# Patient Record
Sex: Female | Born: 2000 | Race: White | Hispanic: No | Marital: Single | Smoking: Never smoker
Health system: Southern US, Community
[De-identification: ages and names within clinical notes are randomized; demographics above are authoritative.]

---
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
## 2023-09-27 ENCOUNTER — Emergency Department
Admission: EM | Admit: 2023-09-27 | Discharge: 2023-09-27 | Disposition: A | Discharge status: Home / Self Care | Attending: Emergency Medicine | Admitting: Emergency Medicine

## 2023-09-27 ENCOUNTER — Encounter: Payer: Self-pay | Admitting: Emergency Medicine

## 2023-09-27 ENCOUNTER — Other Ambulatory Visit: Payer: Self-pay

## 2023-09-27 DIAGNOSIS — S60466A Insect bite (nonvenomous) of right little finger, initial encounter: Secondary | ICD-10-CM | POA: Insufficient documentation

## 2023-09-27 DIAGNOSIS — L03011 Cellulitis of right finger: Secondary | ICD-10-CM | POA: Diagnosis not present

## 2023-09-27 DIAGNOSIS — W57XXXA Bitten or stung by nonvenomous insect and other nonvenomous arthropods, initial encounter: Secondary | ICD-10-CM | POA: Insufficient documentation

## 2023-09-27 DIAGNOSIS — R2231 Localized swelling, mass and lump, right upper limb: Secondary | ICD-10-CM | POA: Diagnosis present

## 2023-09-27 MED ORDER — DIPHENHYDRAMINE HCL 25 MG PO CAPS
25.0000 mg | ORAL_CAPSULE | Freq: Four times a day (QID) | ORAL | Status: DC | PRN
  Administered 2023-09-27: 25 mg via ORAL
  Filled 2023-09-27: qty 1

## 2023-09-27 MED ORDER — CEPHALEXIN 750 MG PO CAPS: 750.0000 mg | ORAL_CAPSULE | Freq: Three times a day (TID) | ORAL | 0 refills | Status: AC

## 2023-09-27 MED ORDER — DIPHENHYDRAMINE HCL 50 MG PO TABS: 50.0000 mg | ORAL_TABLET | Freq: Every evening | ORAL | 0 refills | Status: AC | PRN

## 2023-09-27 MED ORDER — CEPHALEXIN 500 MG PO CAPS
500.0000 mg | ORAL_CAPSULE | Freq: Once | ORAL | Status: AC
  Administered 2023-09-27: 500 mg via ORAL
  Filled 2023-09-27: qty 1

## 2023-09-27 MED ORDER — IBUPROFEN 600 MG PO TABS
600.0000 mg | ORAL_TABLET | Freq: Once | ORAL | Status: AC
  Administered 2023-09-27: 600 mg via ORAL
  Filled 2023-09-27: qty 1

## 2023-09-27 MED ORDER — CEPHALEXIN 250 MG/5ML PO SUSR: 500.0000 mg | Freq: Three times a day (TID) | ORAL | 0 refills | Status: DC

## 2023-09-27 NOTE — ED Triage Notes (Signed)
 Pt to ED via POV with c/o insect bite to R 5th digit. Pt states pain and swelling yesterday that resolved, then today pain and swelling returned. Pt with some redness and swelling to R 5th digit. Pt states unsure what the insect was. Pt states pain up her R arm.

## 2023-09-27 NOTE — ED Provider Notes (Signed)
 [facility] Provider Note    Event Date/Time   First MD Initiated Contact with Patient 09/27/23 2136     (approximate)   History   Insect Bite    HPI  Laura Randolph is a 23 y.o. female  with no significant past medical history who presents to the ED complaining of insect bite. According to the patient, yesterday evening she had an insect bite the right fifth finger, had pruritus.  Today the area is tender, red and warmth and the pain is extending to the anterior area of the forearm.  Patient denies fever.      Physical Exam   Triage Vital Signs: ED Triage Vitals  Encounter Vitals Group     BP 09/27/23 2130 (!) 141/88     Systolic BP Percentile --      Diastolic BP Percentile --      Pulse Rate 09/27/23 2130 79     Resp 09/27/23 2130 19     Temp 09/27/23 2130 98.2 F (36.8 C)     Temp Source 09/27/23 2130 Oral     SpO2 09/27/23 2130 100 %     Weight 09/27/23 2128 160 lb (72.6 kg)     Height 09/27/23 2128 5\' 10"  (1.778 m)     Head Circumference --      Peak Flow --      Pain Score 09/27/23 2128 7     Pain Loc --      Pain Education --      Exclude from Growth Chart --     Most recent vital signs: Vitals:   09/27/23 2130  BP: (!) 141/88  Pulse: 79  Resp: 19  Temp: 98.2 F (36.8 C)  SpO2: 100%     Constitutional: Alert, NAD. Able to speak in complete sentences without cough or dyspnea  Eyes: Conjunctivae are normal.  Head: Atraumatic. Nose: No congestion/rhinnorhea. Mouth/Throat: Mucous membranes are moist.   Neck: Painless ROM. Supple. No JVD, nodes, thyromegaly  Cardiovascular:   Good peripheral circulation.RRR no murmurs, gallops, rubs  Respiratory: Normal respiratory effort.  No retractions. Clear to auscultation bilaterally without wheezing or crackles  Gastrointestinal: Soft and nontender.  Musculoskeletal:  no deformity Neurologic:  MAE spontaneously. No gross focal neurologic deficits are appreciated.  Skin: Right  hand: Right 4th and 5th fingers with erythema, warmth, tenderness.  Vesicles or pustules full ROM Psychiatric: Mood and affect are normal. Speech and behavior are normal.    ED Results / Procedures / Treatments   Labs (all labs ordered are listed, but only abnormal results are displayed) Labs Reviewed - No data to display   EKG     RADIOLOGY     PROCEDURES:  Critical Care performed:   Procedures   MEDICATIONS ORDERED IN ED: Medications  cephALEXin (KEFLEX) capsule 500 mg (has no administration in time range)  diphenhydrAMINE  (BENADRYL ) capsule 25 mg (has no administration in time range)  ibuprofen (ADVIL) tablet 600 mg (has no administration in time range)      IMPRESSION / MDM / ASSESSMENT AND PLAN / ED COURSE  I reviewed the triage vital signs and the nursing notes.  Differential diagnosis includes, but is not limited to, insect bite, cellulitis, foreign body  Patient's presentation is most consistent with acute, uncomplicated illness.   Patient's diagnosis is consistent with cellulitis secondary to insect bite,  physical exam is reassuring, I did mark the cellulitic area for a follow-up. I did review the patient's allergies and medications.During  admission patient received Benadryl  and Keflex. The patient is in stable and satisfactory condition for discharge home  Patient will be discharged home with prescriptions for Keflex and Benadryl . Patient is to follow up with PCP as needed or otherwise directed. Patient is given ED precautions to return to the ED for any worsening or new symptoms. Discussed plan of care with patient, answered all of patient's questions, Patient agreeable to plan of care. Advised patient to take medications according to the instructions on the label. Discussed possible side effects of new medications. Patient verbalized understanding.    FINAL CLINICAL IMPRESSION(S) / ED DIAGNOSES   Final diagnoses:  Insect bite of right little finger,  initial encounter  Cellulitis of finger of right hand     Rx / DC Orders   ED Discharge Orders          Ordered    cephALEXin (KEFLEX) 250 MG/5ML suspension  3 times daily,   Status:  Discontinued        09/27/23 2215    diphenhydrAMINE  (BENADRYL ) 50 MG tablet  At bedtime PRN        09/27/23 2215    cephALEXin (KEFLEX) 750 MG capsule  3 times daily        09/27/23 2217             Note:  This document was prepared using Dragon voice recognition software and may include unintentional dictation errors.   Awilda Lennox, PA-C 09/27/23 2218    Jacquie Maudlin, MD 09/27/23 2333

## 2023-09-27 NOTE — Discharge Instructions (Addendum)
 You have been diagnosed with cellulitis secondary to insect bite.  Please take Keflex 1 capsule by mouth every 8 hours after main meals.  Please take Benadryl  1 tablet by mouth at bedtime as needed for itching.  Please come back to ED or go to your PCP if the extension of the redness or passes the marked area.  You can take ibuprofen for pain or fever.

## 2023-09-28 ENCOUNTER — Other Ambulatory Visit: Payer: Self-pay

## 2023-09-28 ENCOUNTER — Emergency Department
Admission: EM | Admit: 2023-09-28 | Discharge: 2023-09-28 | Disposition: A | Discharge status: Home / Self Care | Attending: Emergency Medicine | Admitting: Emergency Medicine

## 2023-09-28 DIAGNOSIS — R21 Rash and other nonspecific skin eruption: Secondary | ICD-10-CM | POA: Diagnosis present

## 2023-09-28 DIAGNOSIS — L03011 Cellulitis of right finger: Secondary | ICD-10-CM | POA: Insufficient documentation

## 2023-09-28 LAB — CBC WITH DIFFERENTIAL/PLATELET
Abs Immature Granulocytes: 0.02 10*3/uL (ref 0.00–0.07)
Basophils Absolute: 0.1 10*3/uL (ref 0.0–0.1)
Basophils Relative: 1 %
Eosinophils Absolute: 0.3 10*3/uL (ref 0.0–0.5)
Eosinophils Relative: 5 %
HCT: 38.5 % (ref 36.0–46.0)
Hemoglobin: 12.5 g/dL (ref 12.0–15.0)
Immature Granulocytes: 0 %
Lymphocytes Relative: 42 %
Lymphs Abs: 2.9 10*3/uL (ref 0.7–4.0)
MCH: 26.4 pg (ref 26.0–34.0)
MCHC: 32.5 g/dL (ref 30.0–36.0)
MCV: 81.2 fL (ref 80.0–100.0)
Monocytes Absolute: 0.7 10*3/uL (ref 0.1–1.0)
Monocytes Relative: 10 %
Neutro Abs: 2.8 10*3/uL (ref 1.7–7.7)
Neutrophils Relative %: 42 %
Platelets: 353 10*3/uL (ref 150–400)
RBC: 4.74 MIL/uL (ref 3.87–5.11)
RDW: 12.7 % (ref 11.5–15.5)
WBC: 6.7 10*3/uL (ref 4.0–10.5)
nRBC: 0 % (ref 0.0–0.2)

## 2023-09-28 LAB — BASIC METABOLIC PANEL WITH GFR
Anion gap: 9 (ref 5–15)
BUN: 14 mg/dL (ref 6–20)
CO2: 22 mmol/L (ref 22–32)
Calcium: 8.9 mg/dL (ref 8.9–10.3)
Chloride: 107 mmol/L (ref 98–111)
Creatinine, Ser: 0.84 mg/dL (ref 0.44–1.00)
GFR, Estimated: >60 mL/min (ref >60)
Glucose, Bld: 56 mg/dL — ABNORMAL LOW (ref 70–99)
Potassium: 3.7 mmol/L (ref 3.5–5.1)
Sodium: 138 mmol/L (ref 135–145)

## 2023-09-28 LAB — LACTIC ACID, PLASMA: Lactic Acid, Venous: 1.3 mmol/L (ref 0.5–1.9)

## 2023-09-28 MED ORDER — DOXYCYCLINE HYCLATE 100 MG PO TABS
100.0000 mg | ORAL_TABLET | Freq: Once | ORAL | Status: AC
  Administered 2023-09-28: 100 mg via ORAL
  Filled 2023-09-28: qty 1

## 2023-09-28 NOTE — ED Triage Notes (Signed)
 Pt reports she was seen here last night for hand cellulitis and told to come back if redness spread, pt reports redness has grown aprox 1 inch in size.

## 2023-09-28 NOTE — ED Notes (Signed)
 Pt states she was seen yesterday for same and was informed if they swelling continued pass the line on her hand drawn to return to the ED.   The top of the pt's hand is noted to be red and swollen. With swelling noted to be pass the line drawn by both ED staff and the new one the pt made today.  She reports shooting pain from in-between her fingers down her arm.

## 2023-09-28 NOTE — ED Notes (Signed)
 Pt CBG noted to be low on blood work results . MD Ray notified of same. Pt given orange juice,crackers and peanut butter.  Pt states she had dinner prior to arrival.

## 2023-09-28 NOTE — Discharge Instructions (Signed)
 Take your previous prescription (cephalexin) as directed.  You do not need to fill the oral prescription for this. I have sent a prescription for an additional antibiotic (doxycycline) to your pharmacy.  Please take this in addition to your prior prescription.  Return to the ER if you have significant worsening of your redness or pain, or if it is not improving within a few days.  Otherwise, follow-up with your primary care doctor as needed.

## 2023-09-28 NOTE — ED Provider Notes (Signed)
 [facility] Provider Note    Event Date/Time   First MD Initiated Contact with Patient 09/28/23 2033     (approximate)   History   Cellulitis   HPI  Laura Randolph is a 23 year old female presenting to the emergency department for evaluation of rash.  On Saturday patient was driving when she felt something in her hair and was bit over her right fifth finger.  Since then she has noticed redness and swelling over the area.  She was seen in our ER yesterday and was placed on Keflex.  She was instructed to return to the ER if she had worsening redness and noticed that the area of redness and swelling had extended today leading her to present back to the ER.  No fevers or chills.  Reports that she has been taking 10 mL of a Keflex suspension over the past 24 hours.     Physical Exam   Triage Vital Signs: ED Triage Vitals [09/28/23 2014]  Encounter Vitals Group     BP 137/88     Systolic BP Percentile      Diastolic BP Percentile      Pulse Rate 76     Resp 18     Temp 98 F (36.7 C)     Temp src      SpO2 97 %     Weight 160 lb (72.6 kg)     Height 5\' 10"  (1.778 m)     Head Circumference      Peak Flow      Pain Score 6     Pain Loc      Pain Education      Exclude from Growth Chart     Most recent vital signs: Vitals:   09/28/23 2014  BP: 137/88  Pulse: 76  Resp: 18  Temp: 98 F (36.7 C)  SpO2: 97%     General: Awake, interactive  CV:  Regular rate, good peripheral perfusion.  Resp:  Unlabored respirations.  Abd:  Nondistended.  Neuro:  Symmetric facial movement, fluid speech Skin:  There is an area of erythema over the right dorsal medial aspect of the hand.  There is a marked area that patient reports is from yesterday in an area slightly further out that the patient reports is from earlier today.  No significant erythema extending beyond this area.  Finger is not held in flexion.  No tenderness over the flexor tendon sheath.  2+  radial pulse.  Intact sensation.     ED Results / Procedures / Treatments   Labs (all labs ordered are listed, but only abnormal results are displayed) Labs Reviewed  BASIC METABOLIC PANEL WITH GFR - Abnormal; Notable for the following components:      Result Value   Glucose, Bld 56 (*)    All other components within normal limits  CBC WITH DIFFERENTIAL/PLATELET  LACTIC ACID, PLASMA     EKG EKG independently reviewed and interpreted by myself demonstrates:    RADIOLOGY Imaging independently reviewed and interpreted by myself demonstrates:   Formal Radiology Read:  No results found.  PROCEDURES:  Critical Care performed: No  Procedures   MEDICATIONS ORDERED IN ED: Medications  doxycycline (VIBRA-TABS) tablet 100 mg (100 mg Oral Given 09/28/23 2125)     IMPRESSION / MDM / ASSESSMENT AND PLAN / ED COURSE  I reviewed the triage vital signs and the nursing notes.  Differential diagnosis includes, but is not limited to, cellulitis secondary to  recent likely bug bite, no evidence of abscess on exam, no evidence of flexor tenosynovitis  Patient's presentation is most consistent with acute complicated illness / injury requiring diagnostic workup.  23 year old female presenting with progressive cellulitis of her right hand.  Less than 48 hours of symptoms, discussed that this does not necessarily recommend treatment failure.  No systemic symptoms.  Labs ordered from triage reassuring including normal white blood cell count and lactate.  Exam is consistent with cellulitis, but do not feel patient requires IV antibiotics at this time.  No evidence of sepsis.  I did discuss continued antibiotics.  Will add doxycycline to broaden coverage.  Instructed to return if she has significant worsening or fails to improve within 48 hours.  Patient comfortable this plan.  Strict return precautions provided.  Patient discharged stable condition.      FINAL CLINICAL IMPRESSION(S) / ED  DIAGNOSES   Final diagnoses:  Cellulitis of finger of right hand     Rx / DC Orders   ED Discharge Orders     None        Note:  This document was prepared using Dragon voice recognition software and may include unintentional dictation errors.   Claria Crofts, MD 09/28/23 2213

## 2023-09-29 ENCOUNTER — Encounter: Payer: Self-pay | Admitting: Emergency Medicine

## 2023-09-29 ENCOUNTER — Telehealth: Payer: Self-pay | Admitting: Emergency Medicine

## 2023-09-29 MED ORDER — DOXYCYCLINE HYCLATE 100 MG PO TABS: 100.0000 mg | ORAL_TABLET | Freq: Two times a day (BID) | ORAL | 0 refills | Status: AC

## 2023-09-29 NOTE — Telephone Encounter (Signed)
 Patient called as rx not sent on initial visit. Doxycycline called into patient's preferred pharmacy.
